# Patient Record
Sex: Male | Born: 1995 | Hispanic: No | Marital: Single | Smoking: Never smoker
Health system: Southern US, Community
[De-identification: ages and names within clinical notes are randomized; demographics above are authoritative.]

---
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
## 2017-10-31 IMAGING — CR DG CHEST 2V
2 series · 2 of 2 positions shown · non-contrast
Comparison: None.

CLINICAL DATA: Cough and headache for

EXAM:
CHEST  2 VIEW

[chest pa]
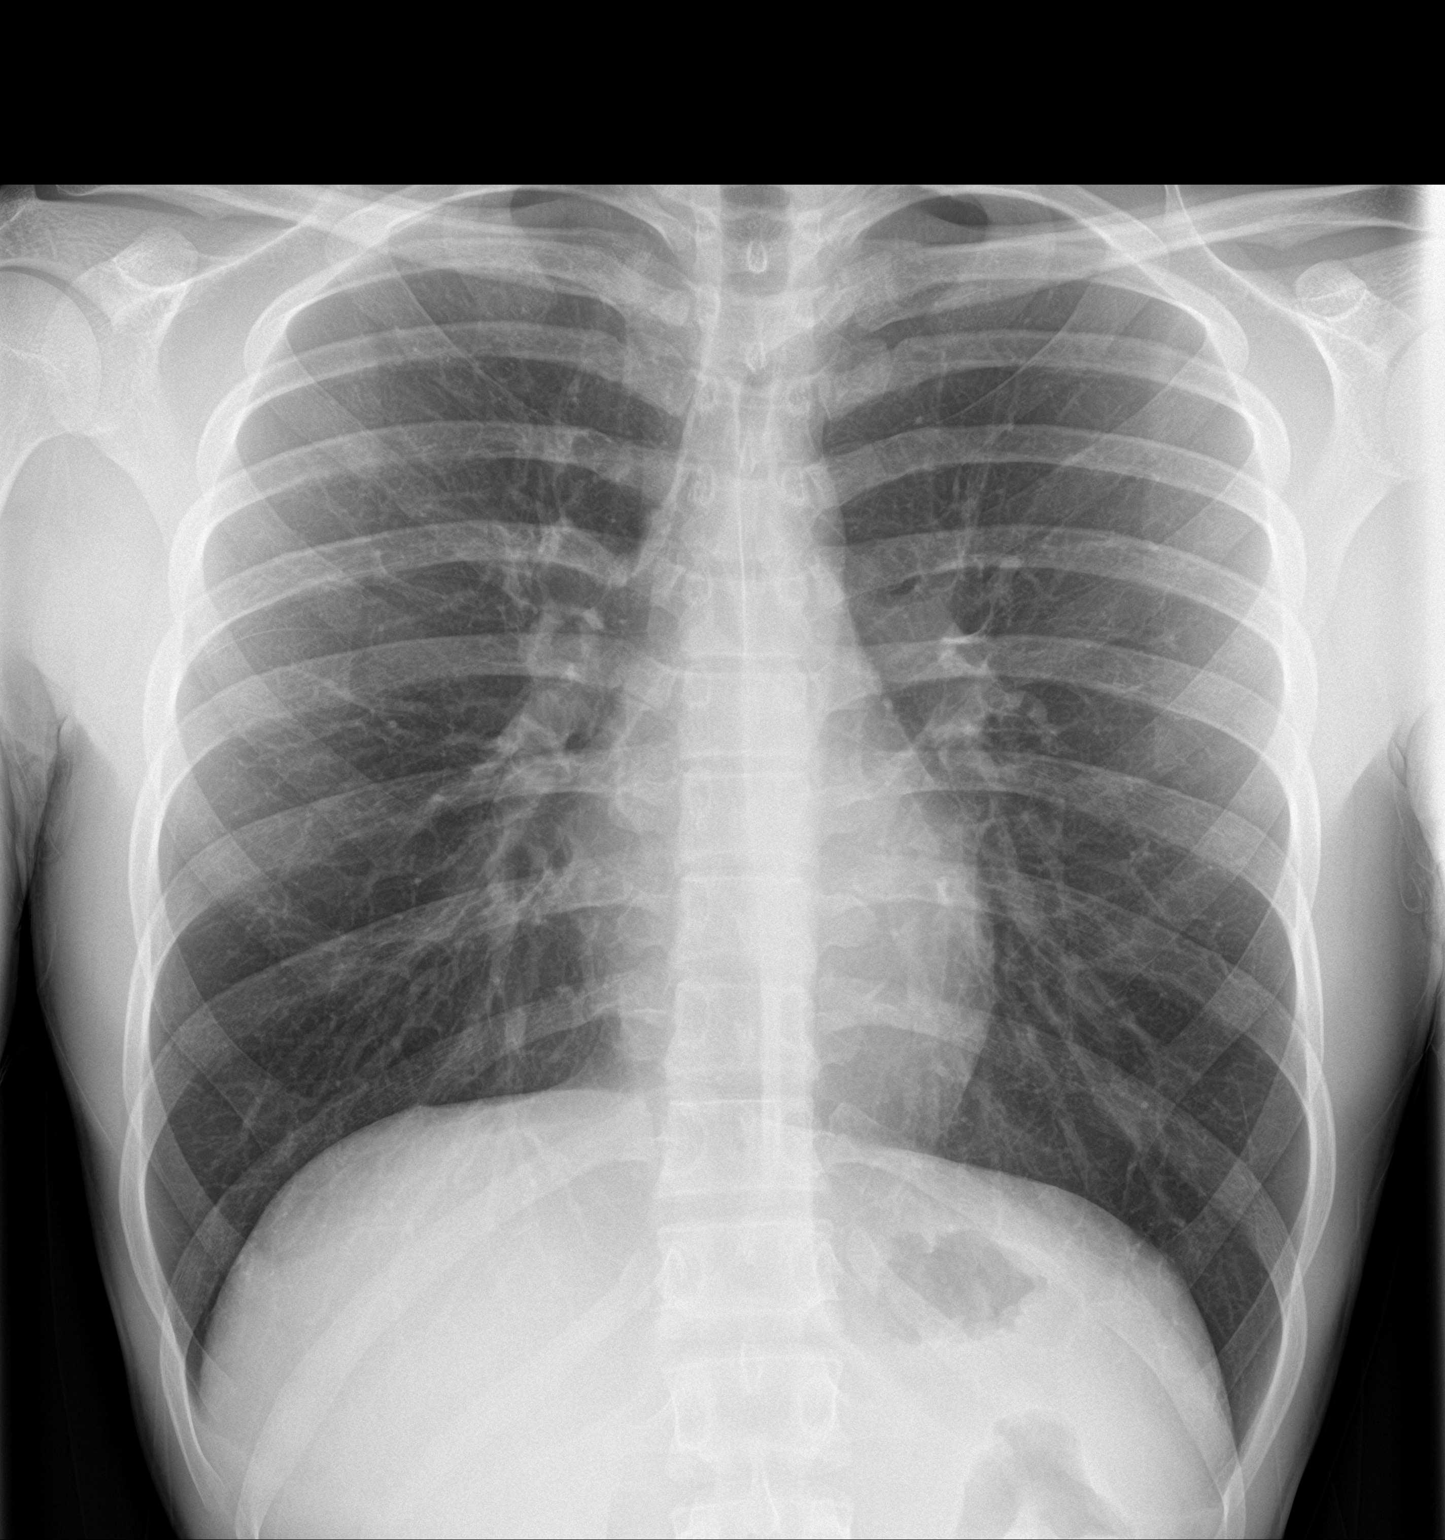

[chest lat]
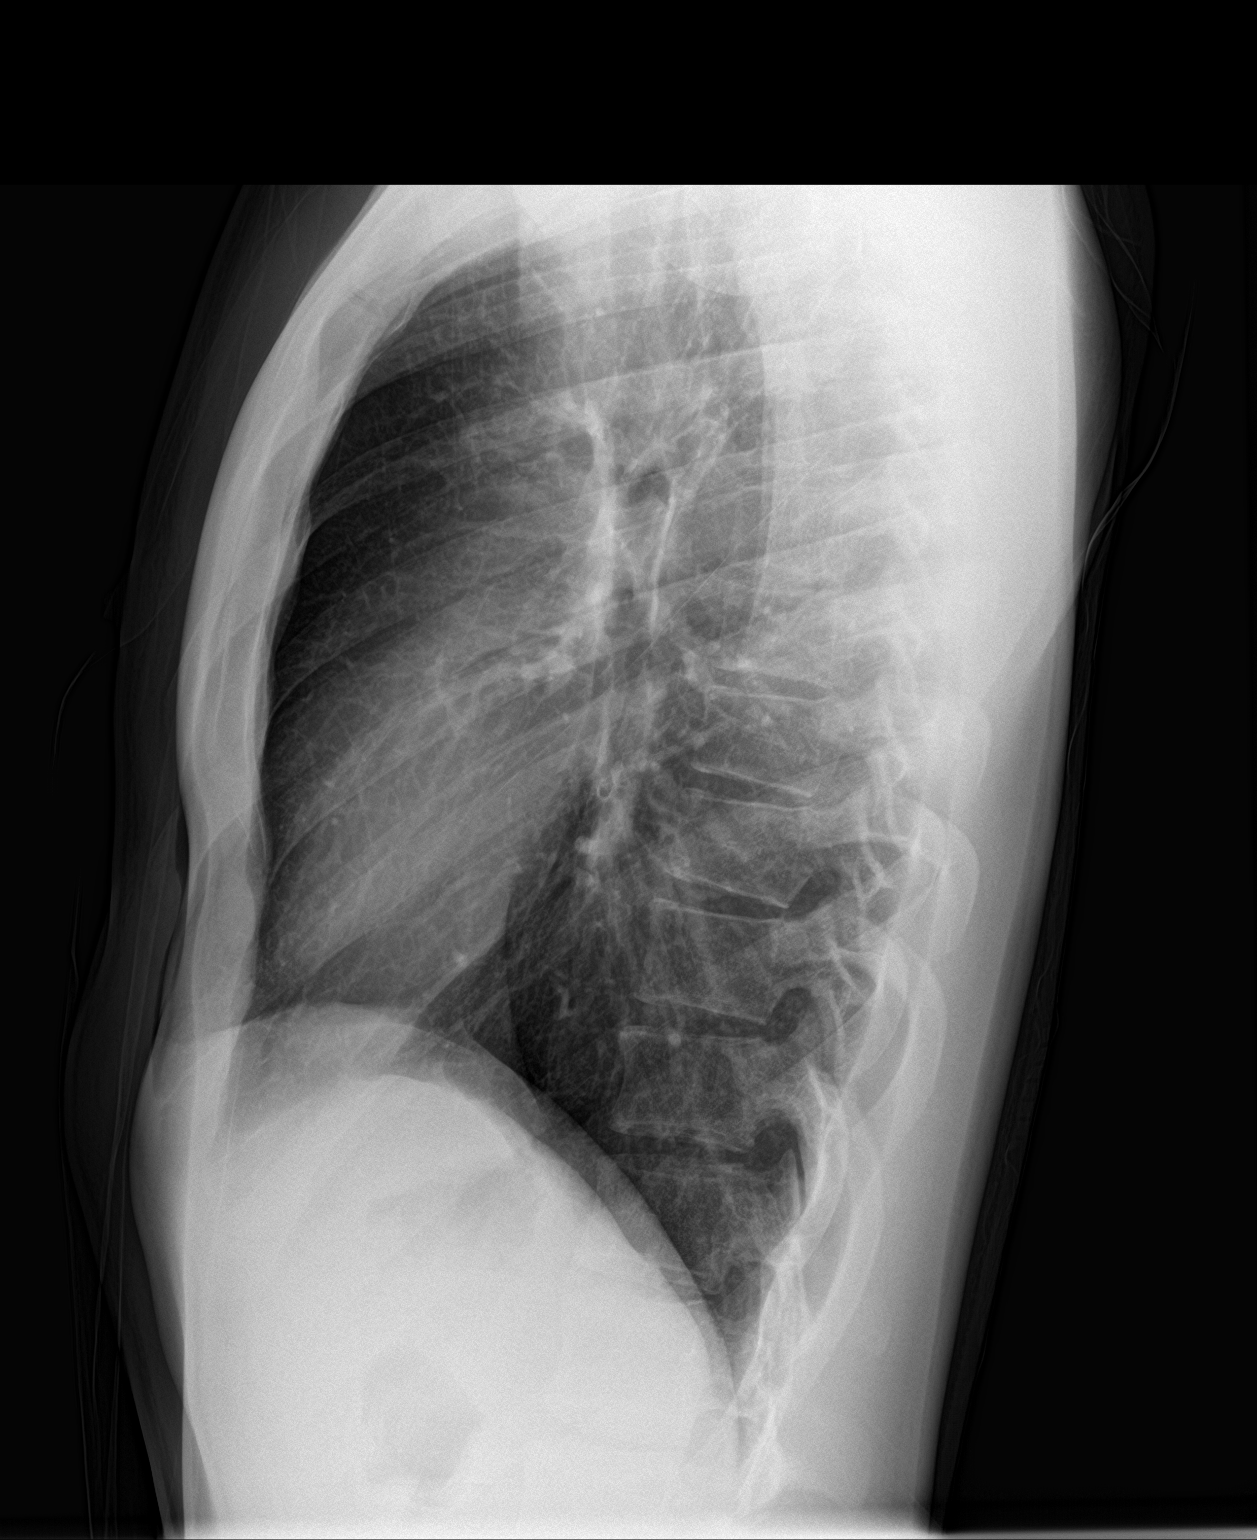

[2 of 2 positions shown; findings below may reference images not displayed]

FINDINGS: The heart size and mediastinal contours are within normal limits.
Both lungs are clear. The visualized skeletal structures are
unremarkable.
IMPRESSION: No active cardiopulmonary disease.
